# Patient Record
Sex: Female | Born: 1995 | Race: Black or African American | Hispanic: No | Marital: Single | Smoking: Never smoker
Health system: Southern US, Community
[De-identification: ages and names within clinical notes are randomized; demographics above are authoritative.]

---
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
## 2015-02-02 ENCOUNTER — Encounter: Payer: Medicaid Other | Admitting: Obstetrics & Gynecology

## 2015-02-26 ENCOUNTER — Ambulatory Visit (INDEPENDENT_AMBULATORY_CARE_PROVIDER_SITE_OTHER): Payer: Medicaid Other | Admitting: Family Medicine

## 2015-02-26 ENCOUNTER — Other Ambulatory Visit: Payer: Self-pay | Admitting: Family Medicine

## 2015-02-26 ENCOUNTER — Encounter: Payer: Self-pay | Admitting: Family Medicine

## 2015-02-26 VITALS — BP 106/71 | HR 77 | Temp 98.4°F | Ht 71.0 in | Wt 159.4 lb

## 2015-02-26 DIAGNOSIS — N898 Other specified noninflammatory disorders of vagina: Secondary | ICD-10-CM

## 2015-02-26 LAB — HIV ANTIBODY (ROUTINE TESTING W REFLEX): HIV 1&2 Ab, 4th Generation: NONREACTIVE

## 2015-02-26 NOTE — Progress Notes (Signed)
   Subjective:    Patient ID: Michele Wallace, female    DOB: [birth year], 19 y.o.   MRN: [id number]  HPI Patient seen for vaginal discharge.  She was seen in ED almost 3 months ago for vaginal discharge and was diagnosed with PID and was treated with Ceftriaxone and azithromycin.  Her discharge went away after treatment.  Her Gc/CT was negative at that time.  She began having discharge 2 days ago.  Describes thin, white, non-foul smelling discharge.  No palliating or provoking factors.  Sexually active with condoms.  Not ready to become pregnant.  Has new partner since January.   Review of Systems  Constitutional: Negative for fever, chills and fatigue.  Gastrointestinal: Negative for nausea, vomiting, abdominal pain, diarrhea and constipation.  Genitourinary: Negative for dysuria, vaginal bleeding, difficulty urinating, vaginal pain and pelvic pain.       Objective:   Physical Exam  Constitutional: She is oriented to person, place, and time. She appears well-developed and well-nourished.  Abdominal: Soft. Bowel sounds are normal. She exhibits no distension and no mass. There is no tenderness. There is no rebound and no guarding.  Genitourinary: There is no rash, tenderness, lesion or injury on the right labia. There is no rash, tenderness, lesion or injury on the left labia. Cervix exhibits no motion tenderness. No erythema, tenderness or bleeding in the vagina. No foreign body around the vagina. No signs of injury around the vagina. No vaginal discharge found.  Neurological: She is alert and oriented to person, place, and time.  Skin: Skin is warm and dry.  Psychiatric: She has a normal mood and affect. Her behavior is normal. Judgment and thought content normal.      Assessment & Plan:   Problem List Items Addressed This Visit    None    Visit Diagnoses    Vaginal discharge    -  Primary    Relevant Orders    GC/chlamydia probe amp, genital    Wet prep, genital    HIV antibody        Will check vaginal cultures.  Will also check HIV test.  Will call with results. Will prescribe Sprintec for patient.

## 2015-02-26 NOTE — Progress Notes (Signed)
Patient ID: Michele Wallace, female   DOB: 10-24-1996, 19 y.o.   MRN: [id number] Today patient is concerned discharge.

## 2015-02-27 ENCOUNTER — Other Ambulatory Visit: Payer: Self-pay | Admitting: Family Medicine

## 2015-02-27 LAB — WET PREP, GENITAL
Trich, Wet Prep: NONE SEEN
Yeast Wet Prep HPF POC: NONE SEEN

## 2015-02-27 LAB — GC/CHLAMYDIA PROBE AMP
CT Probe RNA: NEGATIVE
GC Probe RNA: NEGATIVE

## 2015-02-27 MED ORDER — METRONIDAZOLE 500 MG PO TABS: 500.0000 mg | ORAL_TABLET | Freq: Two times a day (BID) | ORAL | Status: AC

## 2015-02-27 NOTE — Progress Notes (Signed)
Wet prep positive for BV - flagyl sent to pharmacy.  Left message on phone.  Gc/CT neg.

## 2015-03-17 ENCOUNTER — Telehealth: Payer: Self-pay

## 2015-03-17 NOTE — Telephone Encounter (Signed)
Pt stated that she recently started having abnormal bleeding and wants to know what to do.

## 2015-03-18 NOTE — Telephone Encounter (Signed)
Returned pt's call and left message on her personal voice mail to call us back and provide additional details regarding her problem. Also please state whether detailed medical information can be left on her voice mail.

## 2015-03-24 NOTE — Telephone Encounter (Signed)
Called Michele Wallace and left another message stating we are returning your call for second time. If you are still having a problem and need our assistance please call back and leave a detailed message with the nature of your problem , best number and time to reach you.

## 2015-04-02 ENCOUNTER — Ambulatory Visit: Payer: Medicaid Other | Admitting: Medical

## 2017-01-06 ENCOUNTER — Emergency Department (HOSPITAL_COMMUNITY): Payer: No Typology Code available for payment source

## 2017-01-06 ENCOUNTER — Emergency Department (HOSPITAL_COMMUNITY)
Admission: EM | Admit: 2017-01-06 | Discharge: 2017-01-06 | Disposition: A | Discharge status: Home / Self Care | Payer: No Typology Code available for payment source | Attending: Emergency Medicine | Admitting: Emergency Medicine

## 2017-01-06 ENCOUNTER — Encounter (HOSPITAL_COMMUNITY): Payer: Self-pay

## 2017-01-06 DIAGNOSIS — Y939 Activity, unspecified: Secondary | ICD-10-CM | POA: Insufficient documentation

## 2017-01-06 DIAGNOSIS — Y999 Unspecified external cause status: Secondary | ICD-10-CM | POA: Diagnosis not present

## 2017-01-06 DIAGNOSIS — S8992XA Unspecified injury of left lower leg, initial encounter: Secondary | ICD-10-CM | POA: Diagnosis present

## 2017-01-06 DIAGNOSIS — S8002XA Contusion of left knee, initial encounter: Secondary | ICD-10-CM | POA: Diagnosis not present

## 2017-01-06 DIAGNOSIS — Y92410 Unspecified street and highway as the place of occurrence of the external cause: Secondary | ICD-10-CM | POA: Diagnosis not present

## 2017-01-06 MED ORDER — DICLOFENAC SODIUM 50 MG PO TBEC: 50.0000 mg | DELAYED_RELEASE_TABLET | Freq: Two times a day (BID) | ORAL | 0 refills | Status: AC
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
## 2017-01-06 NOTE — Progress Notes (Signed)
Orthopedic Tech Progress Note Patient Details:  Michele Wallace 01/16/1996 [id number]  Ortho Devices Type of Ortho Device: Crutches, Knee Sleeve Ortho Device/Splint Location: LLE Ortho Device/Splint Interventions: Ordered, Application   Jennye MoccasinHughes, Trinidad Ingle Craig 01/06/2017, 10:43 PM

## 2017-01-06 NOTE — ED Notes (Signed)
Patient transported to X-ray 
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
## 2017-09-23 ENCOUNTER — Emergency Department (HOSPITAL_COMMUNITY)
Admission: EM | Admit: 2017-09-23 | Discharge: 2017-09-23 | Disposition: A | Discharge status: Home / Self Care | Payer: BLUE CROSS/BLUE SHIELD | Attending: Emergency Medicine | Admitting: Emergency Medicine

## 2017-09-23 ENCOUNTER — Emergency Department (HOSPITAL_COMMUNITY): Payer: BLUE CROSS/BLUE SHIELD

## 2017-09-23 ENCOUNTER — Encounter (HOSPITAL_COMMUNITY): Payer: Self-pay

## 2017-09-23 DIAGNOSIS — S0003XA Contusion of scalp, initial encounter: Secondary | ICD-10-CM | POA: Diagnosis not present

## 2017-09-23 DIAGNOSIS — S060X0A Concussion without loss of consciousness, initial encounter: Secondary | ICD-10-CM | POA: Insufficient documentation

## 2017-09-23 DIAGNOSIS — Z79899 Other long term (current) drug therapy: Secondary | ICD-10-CM | POA: Diagnosis not present

## 2017-09-23 DIAGNOSIS — W07XXXA Fall from chair, initial encounter: Secondary | ICD-10-CM | POA: Diagnosis not present

## 2017-09-23 DIAGNOSIS — Y929 Unspecified place or not applicable: Secondary | ICD-10-CM | POA: Insufficient documentation

## 2017-09-23 DIAGNOSIS — S0990XA Unspecified injury of head, initial encounter: Secondary | ICD-10-CM | POA: Diagnosis not present

## 2017-09-23 DIAGNOSIS — Y998 Other external cause status: Secondary | ICD-10-CM | POA: Insufficient documentation

## 2017-09-23 DIAGNOSIS — Y9389 Activity, other specified: Secondary | ICD-10-CM | POA: Insufficient documentation

## 2017-09-23 NOTE — ED Provider Notes (Signed)
[facility]-EMERGENCY DEPT Provider Note   CSN: [id number] Arrival date & time: 09/23/17  [id number]     History   Chief Complaint Chief Complaint  Patient presents with  . Fall  . Head Injury    HPI Michele Wallace is a 21 y.o. female.  Patient presents to the emergency department with chief complaint of head injury.  She states that she slipped off the back of a barstool.  She was drinking.  She denies loss of consciousness.  She states that she has had 2 episodes of vomiting.  She also reports some amnesia after the incident.  She denies any numbness, weakness, or tingling.  Denies any other associated symptoms.  There are no modifying factors.   The history is provided by the patient. No language interpreter was used.    History reviewed. No pertinent past medical history.  There are no active problems to display for this patient.   History reviewed. No pertinent surgical history.  OB History    Gravida Para Term Preterm AB Living   0 0 0 0 0 0   SAB TAB Ectopic Multiple Live Births   0 0 0 0         Home Medications    Prior to Admission medications   Medication Sig Start Date End Date Taking? Authorizing Provider  diclofenac (VOLTAREN) 50 MG EC tablet Take 1 tablet (50 mg total) by mouth 2 (two) times daily. 01/06/17   Janne NapoleonNeese, Hope M, NP  metroNIDAZOLE (FLAGYL) 500 MG tablet Take 1 tablet (500 mg total) by mouth 2 (two) times daily. 02/27/15   Levie HeritageStinson, Jacob J, DO    Family History Family History  Problem Relation Age of Onset  . Diabetes Father     Social History Social History  Substance Use Topics  . Smoking status: Never Smoker  . Smokeless tobacco: Never Used  . Alcohol use 0.0 oz/week     Comment: occasional     Allergies   Patient has no known allergies.   Review of Systems Review of Systems  All other systems reviewed and are negative.    Physical Exam Updated Vital Signs BP 120/84 (BP Location: Left Arm)   Pulse  75   Temp 98.3 F (36.8 C) (Oral)   Resp 20   LMP 09/01/2017   SpO2 100%   Physical Exam  Constitutional: She is oriented to person, place, and time. She appears well-developed and well-nourished.  HENT:  Head: Normocephalic and atraumatic.  Posterior scalp hematoma, no laceration  Eyes: Pupils are equal, round, and reactive to light. Conjunctivae and EOM are normal.  Neck: Normal range of motion. Neck supple.  Cardiovascular: Normal rate and regular rhythm.  Exam reveals no gallop and no friction rub.   No murmur heard. Pulmonary/Chest: Effort normal and breath sounds normal. No respiratory distress. She has no wheezes. She has no rales. She exhibits no tenderness.  Abdominal: Soft. Bowel sounds are normal. She exhibits no distension and no mass. There is no tenderness. There is no rebound and no guarding.  Musculoskeletal: Normal range of motion. She exhibits no edema or tenderness.  Neurological: She is alert and oriented to person, place, and time.  Skin: Skin is warm and dry.  Psychiatric: She has a normal mood and affect. Her behavior is normal. Judgment and thought content normal.  Nursing note and vitals reviewed.    ED Treatments / Results  Labs (all labs ordered are listed, but only abnormal results are displayed)  Labs Reviewed - No data to display  EKG  EKG Interpretation None       Radiology Dg Lumbar Spine Complete  Result Date: 09/23/2017 CLINICAL DATA:  Status post fall off chair, landing on back. Left lower back pain, acute onset. Initial encounter. EXAM: LUMBAR SPINE - COMPLETE 4+ VIEW COMPARISON:  None. FINDINGS: There is no evidence of fracture or subluxation. Vertebral bodies demonstrate normal height and alignment. Intervertebral disc spaces are preserved. The visualized neural foramina are grossly unremarkable in appearance. The visualized bowel gas pattern is unremarkable in appearance; air and stool are noted within the colon. The sacroiliac joints  are within normal limits. IMPRESSION: No evidence of fracture or subluxation along the lumbar spine. Electronically Signed   By: Roanna Raider M.D.   On: 09/23/2017 05:53   Ct Head Wo Contrast  Result Date: 09/23/2017 CLINICAL DATA:  Larey Seat from bar stool, struck head. No loss of consciousness. EXAM: CT HEAD WITHOUT CONTRAST TECHNIQUE: Contiguous axial images were obtained from the base of the skull through the vertex without intravenous contrast. COMPARISON:  None. FINDINGS: BRAIN: No intraparenchymal hemorrhage, mass effect nor midline shift. The ventricles and sulci are normal. No acute large vascular territory infarcts. No abnormal extra-axial fluid collections. Basal cisterns are patent. VASCULAR: Unremarkable. SKULL/SOFT TISSUES: No skull fracture. No significant soft tissue swelling. ORBITS/SINUSES: The included ocular globes and orbital contents are normal.The mastoid aircells and included paranasal sinuses are well-aerated. Fullness of the adenoidal soft tissues. OTHER: None. IMPRESSION: Normal noncontrast CT HEAD. Electronically Signed   By: Awilda Metro M.D.   On: 09/23/2017 06:00    Procedures Procedures (including critical care time)  Medications Ordered in ED Medications - No data to display   Initial Impression / Assessment and Plan / ED Course  I have reviewed the triage vital signs and the nursing notes.  Pertinent labs & imaging results that were available during my care of the patient were reviewed by me and considered in my medical decision making (see chart for details).     Patient with fall and head injury.  Will check Ct.  Also complains of some low back pain.  Will get imaging due to the fall.  Anticipate discharge to home.  Imaging is reassuring.  We will discharged home with close follow-up.  Patient understands and agrees to plan.  Roommates that we will stay and watch her today.  Final Clinical Impressions(s) / ED Diagnoses   Final diagnoses:  Injury of  head, initial encounter  Concussion without loss of consciousness, initial encounter    New Prescriptions New Prescriptions   No medications on file     Roxy Horseman, Cordelia Poche 09/23/17 0626    Paula Libra, MD 09/23/17 [phone]

## 2017-09-23 NOTE — ED Triage Notes (Signed)
Pt fell straight back off a barstool about one hour ago She fell and hit the back of her head first, no LOC, there is a small spot of blood on the back of her head

## 2018-05-22 IMAGING — CR DG LUMBAR SPINE COMPLETE 4+V
5 series · 5 of 5 positions shown · non-contrast
Comparison: None.

CLINICAL DATA: Status post fall off chair, landing on back. Left
lower back pain, acute onset. Initial encounter.

EXAM:
LUMBAR SPINE - COMPLETE 4+ VIEW

[t lumbar spine ap]
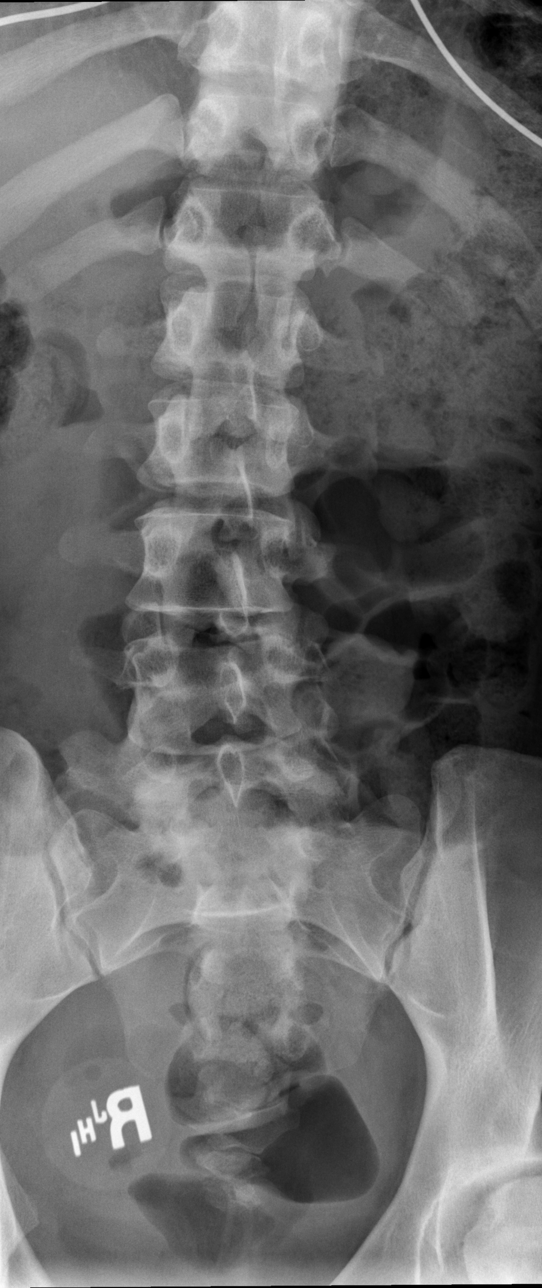

[t lumbar spine obl (1 of 2)]
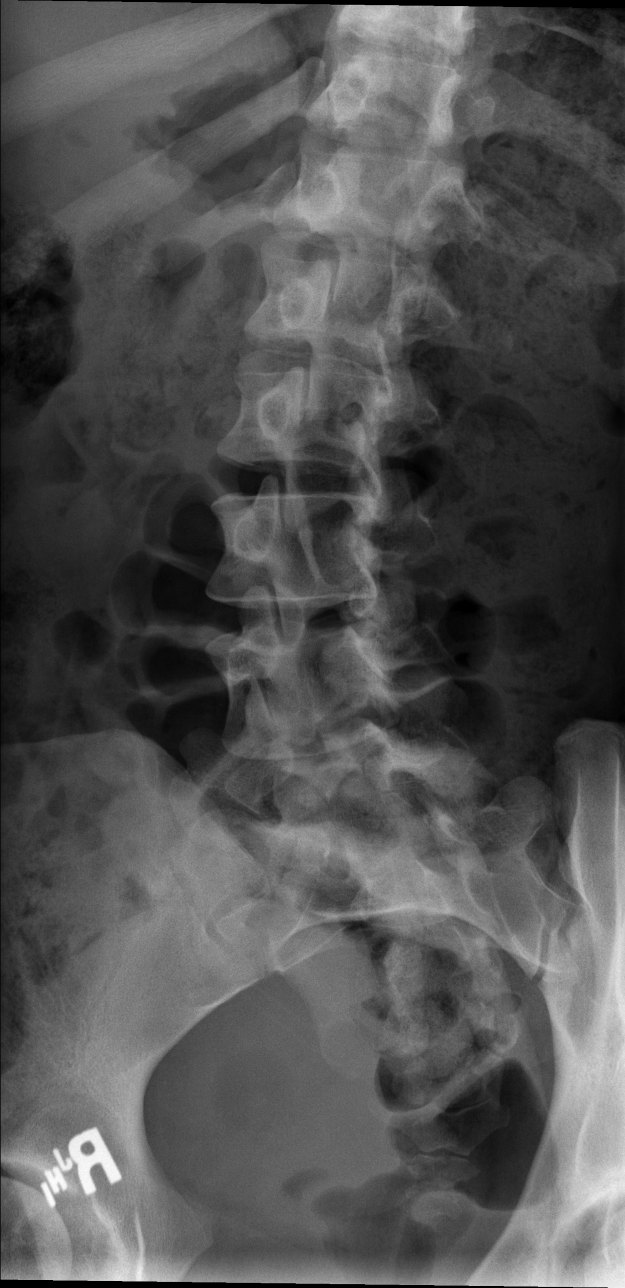

[t lumbar spine obl (2 of 2)]
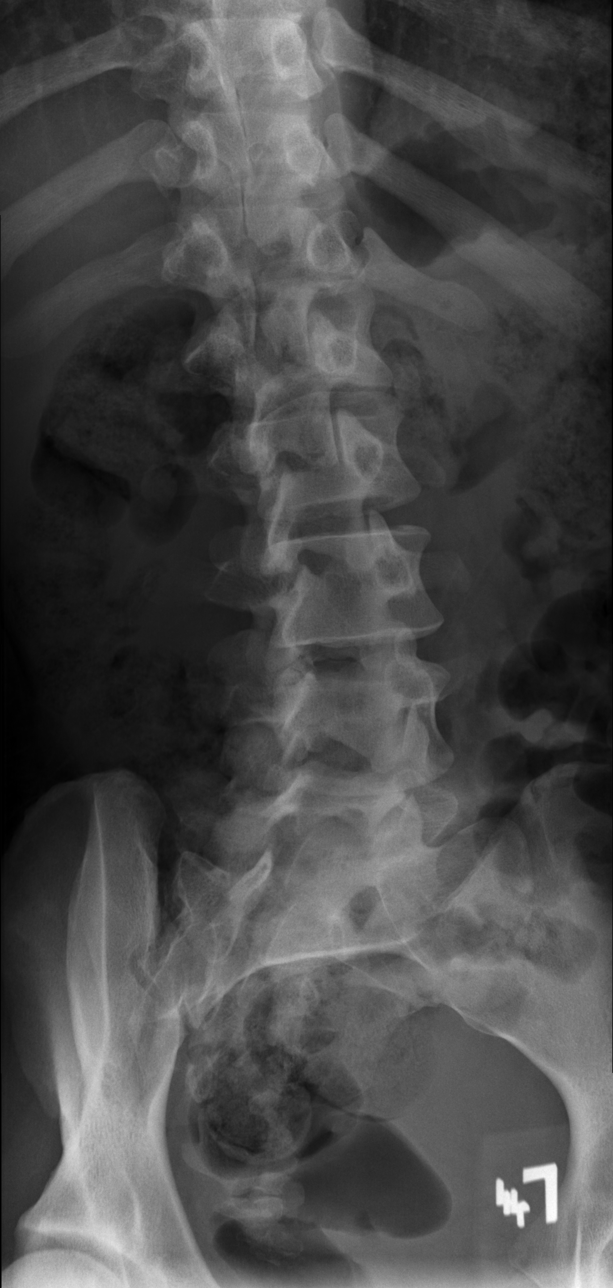

[t lumbar spine lat]
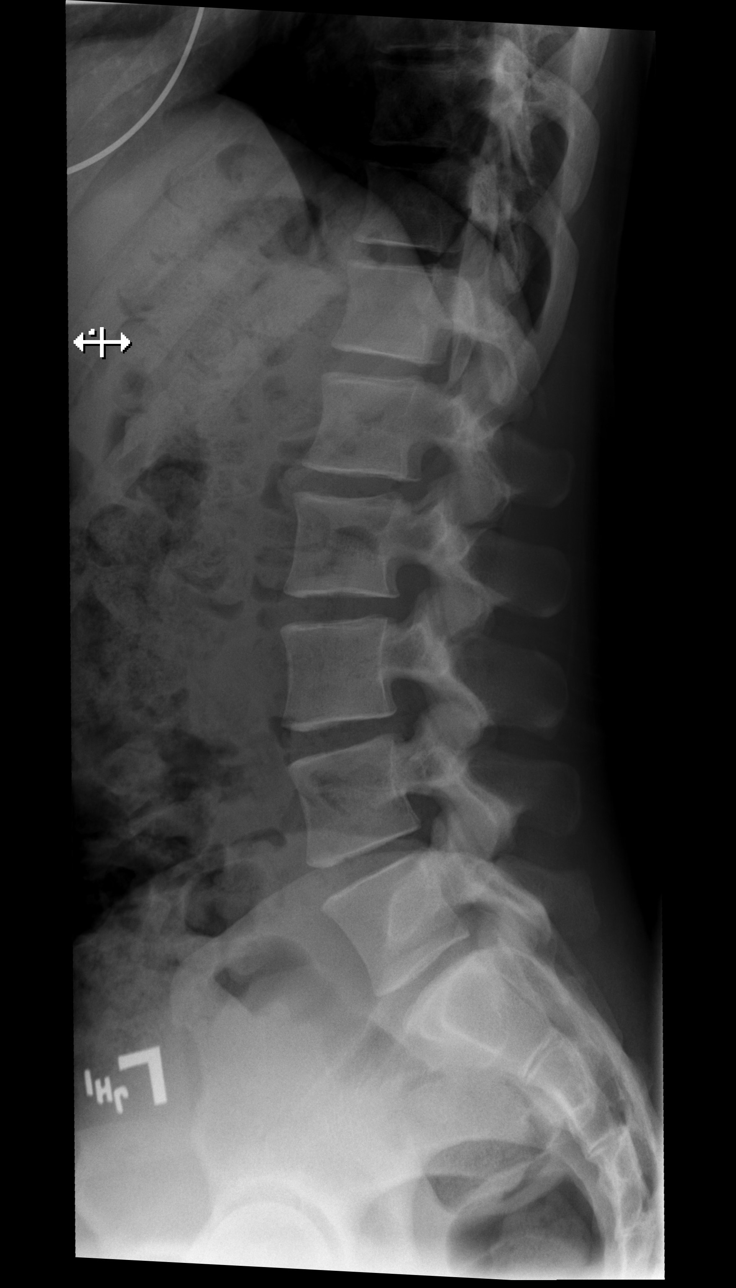

[t lumbar l-5 s-1 spot]
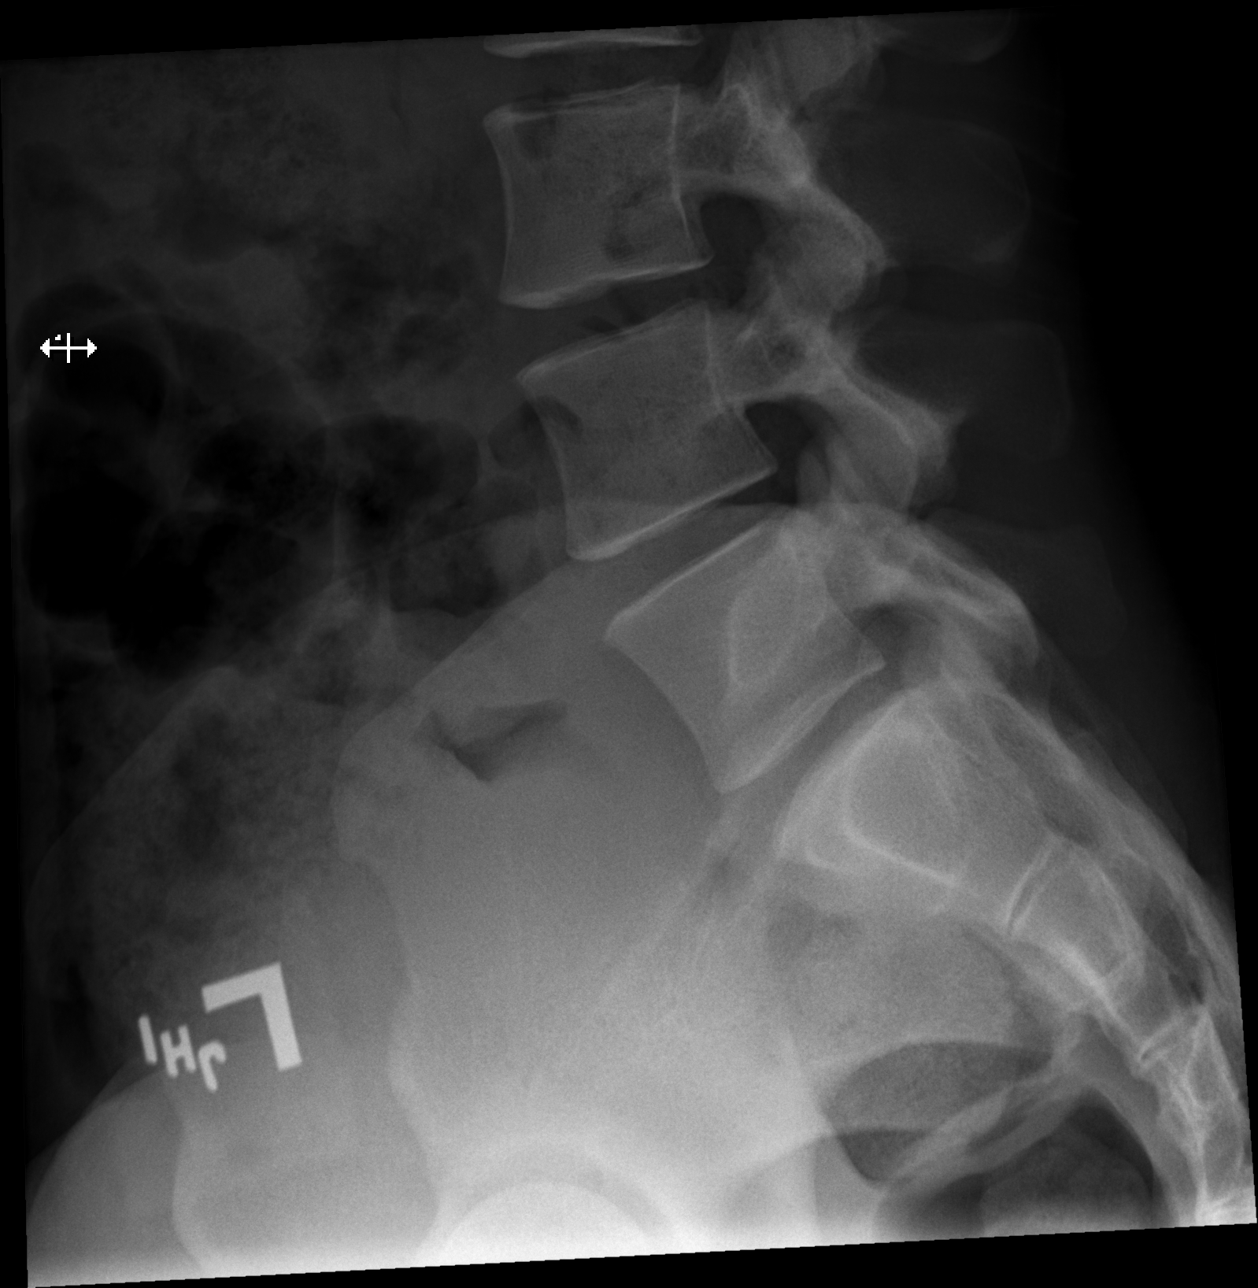

[5 of 5 positions shown; findings below may reference images not displayed]

FINDINGS: There is no evidence of fracture or subluxation. Vertebral bodies
demonstrate normal height and alignment. Intervertebral disc spaces
are preserved. The visualized neural foramina are grossly
unremarkable in appearance.

The visualized bowel gas pattern is unremarkable in appearance; air
and stool are noted within the colon. The sacroiliac joints are
within normal limits.
IMPRESSION: No evidence of fracture or subluxation along the lumbar spine.

## 2019-06-05 ENCOUNTER — Other Ambulatory Visit: Payer: Self-pay | Admitting: Critical Care Medicine

## 2019-06-05 DIAGNOSIS — Z20822 Contact with and (suspected) exposure to covid-19: Secondary | ICD-10-CM

## 2019-06-10 LAB — NOVEL CORONAVIRUS, NAA: SARS-CoV-2, NAA: NOT DETECTED
